# Patient Record
Sex: Female | Born: 1935 | Race: White | Hispanic: No | Marital: Single | Smoking: Never smoker
Health system: Southern US, Community
[De-identification: ages and names within clinical notes are randomized; demographics above are authoritative.]

## PROBLEM LIST (undated history)

## (undated) DIAGNOSIS — K219 Gastro-esophageal reflux disease without esophagitis: Secondary | ICD-10-CM

## (undated) DIAGNOSIS — I4891 Unspecified atrial fibrillation: Secondary | ICD-10-CM

---

## 2005-03-17 ENCOUNTER — Ambulatory Visit: Payer: Self-pay | Admitting: Cardiology

## 2010-03-15 ENCOUNTER — Encounter: Payer: Self-pay | Admitting: Emergency Medicine

## 2010-05-18 ENCOUNTER — Encounter: Payer: Self-pay | Admitting: Gastroenterology

## 2011-07-13 IMAGING — CT CT HEAD W/O CM
1 of 2 series · 13 of 30 positions shown, 17 images · non-contrast
Comparison: None

CLINICAL DATA: Fall.  Left-sided weakness.

CT HEAD WITHOUT CONTRAST
TECHNIQUE: Contiguous axial images were obtained from the base of
the skull through the vertex without contrast.

[Series 2: brain · axial · 0.47mm/px · z∈[+139,+265]mm · 13 of 28 slices shown, 17 images]
[im 2/28  brain]
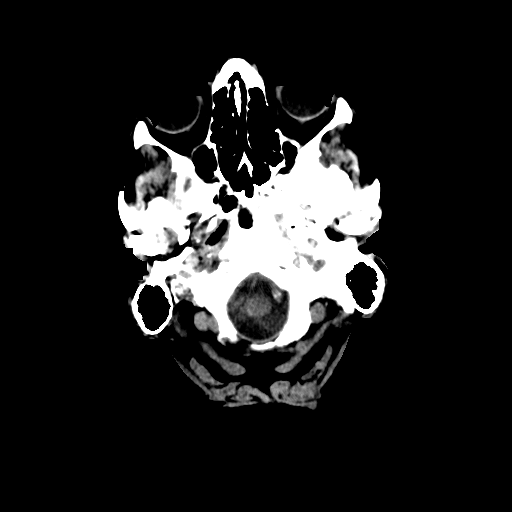
[im 2/28  bone]
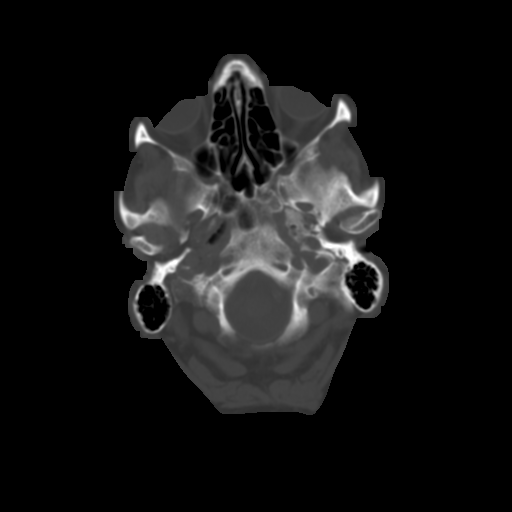
[im 4/28  brain]
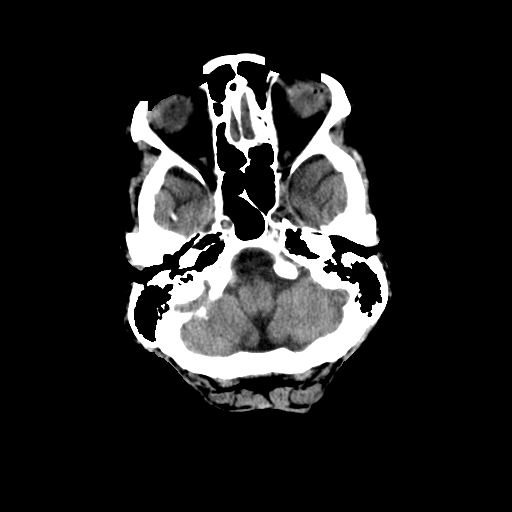
[im 6/28  brain]
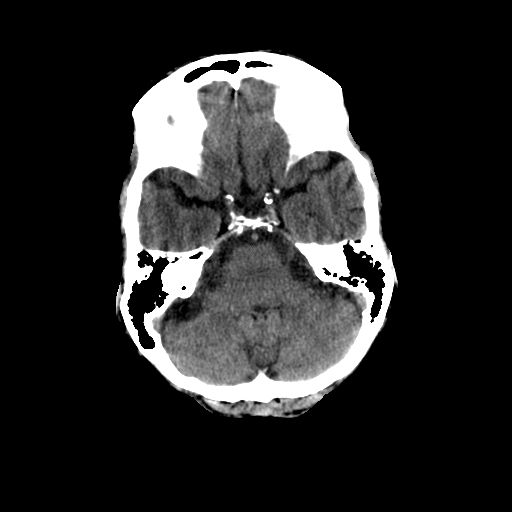
[im 8/28  brain]
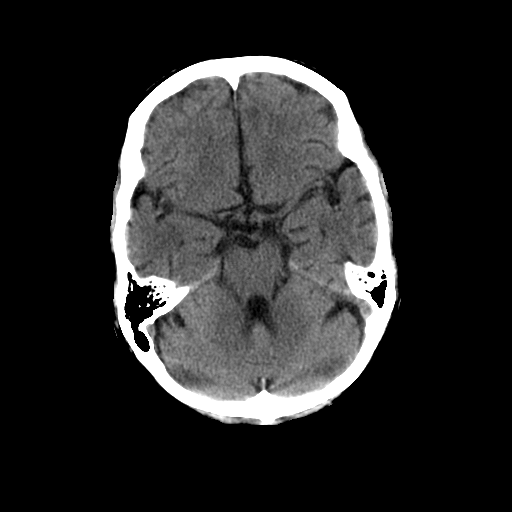
[im 10/28  brain]
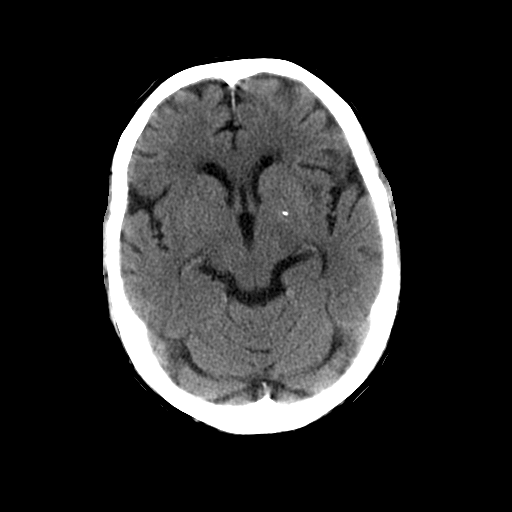
[im 10/28  bone]
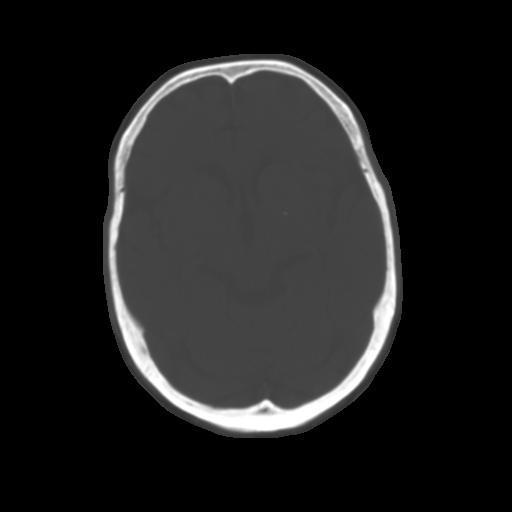
[im 12/28  brain]
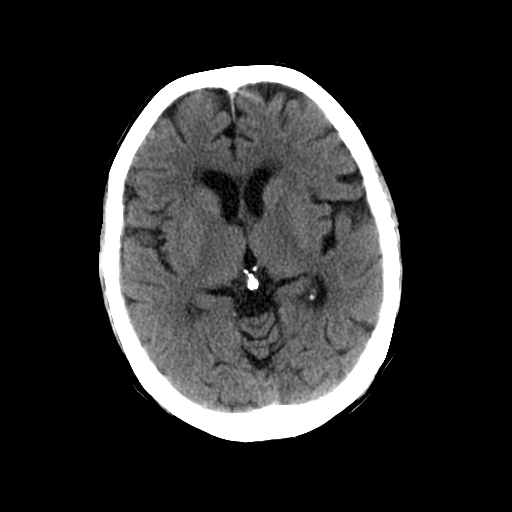
[im 14/28  brain]
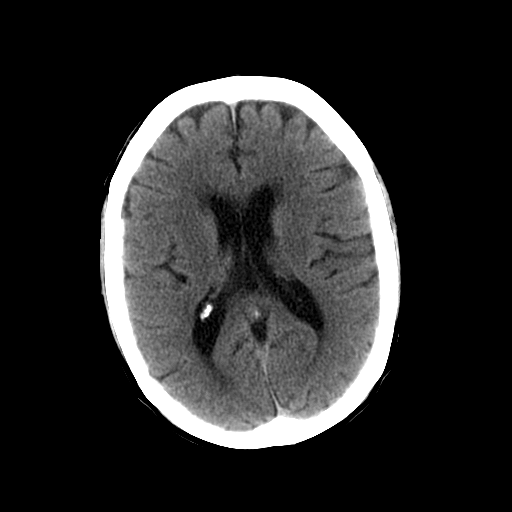
[im 16/28  brain]
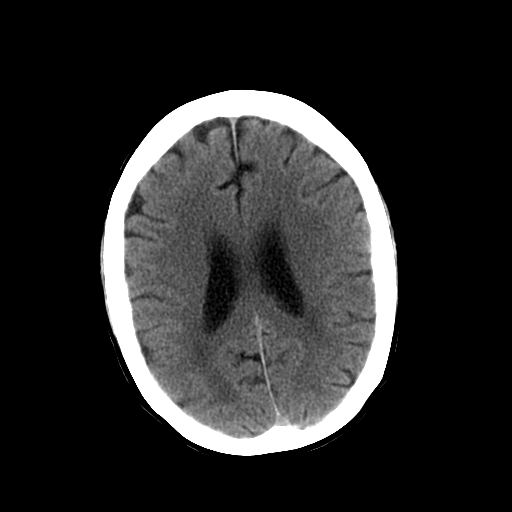
[im 18/28  brain]
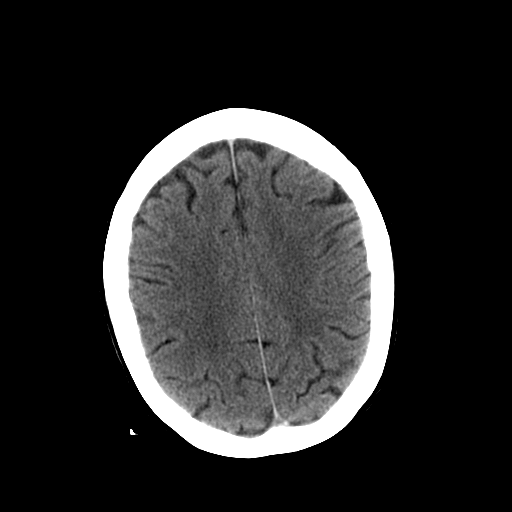
[im 18/28  bone]
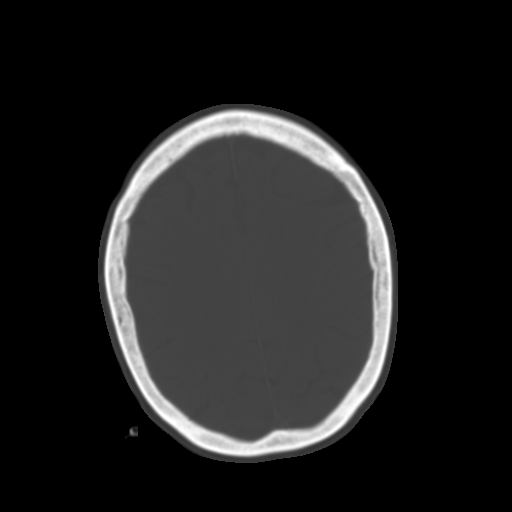
[im 20/28  brain]
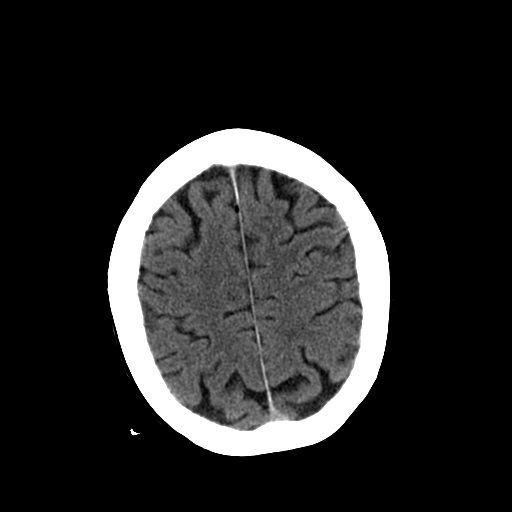
[im 22/28  brain]
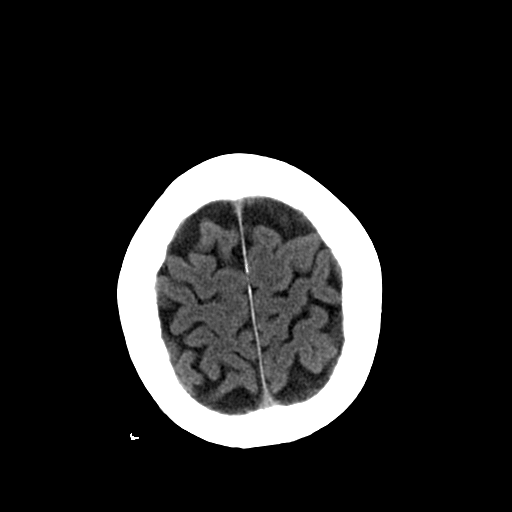
[im 24/28  brain]
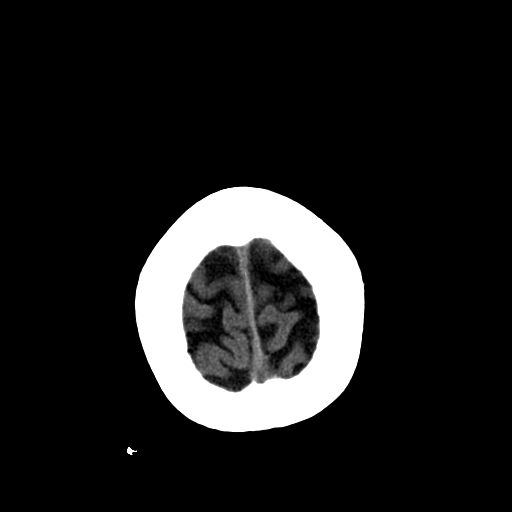
[im 26/28  brain]
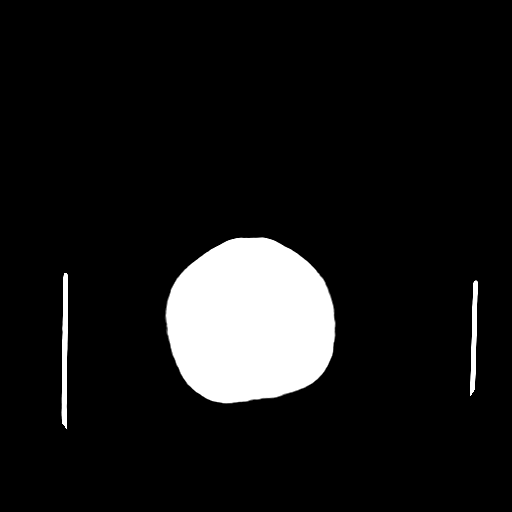
[im 26/28  bone]
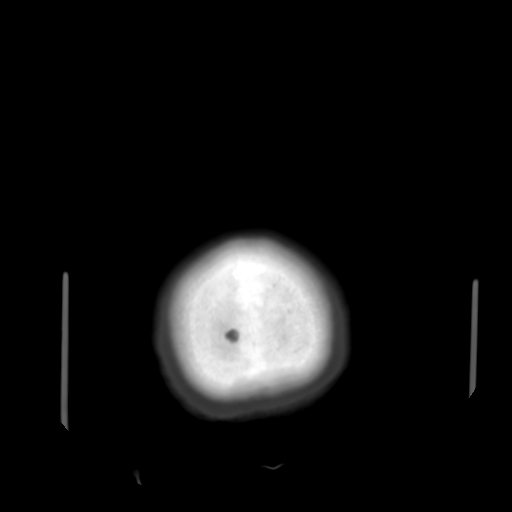

[13 of 30 positions shown; findings below may reference images not displayed]

FINDINGS: Age appropriate atrophy.  Chronic ischemic changes in the
white matter.  Sub insular hypodensity on the left appears chronic.
No acute cortical infarct.  Negative for hemorrhage or mass lesion.

Negative for skull fracture.  Visualized sinuses are clear.
IMPRESSION: Atrophy and chronic microvascular ischemia.  No acute abnormality.

## 2011-09-19 ENCOUNTER — Encounter: Payer: Self-pay | Admitting: Pulmonary Disease

## 2016-06-28 ENCOUNTER — Encounter (HOSPITAL_COMMUNITY): Payer: Self-pay | Admitting: *Deleted

## 2016-06-28 ENCOUNTER — Emergency Department (HOSPITAL_COMMUNITY): Payer: Self-pay

## 2016-06-28 ENCOUNTER — Emergency Department (HOSPITAL_COMMUNITY)
Admission: EM | Admit: 2016-06-28 | Discharge: 2016-06-28 | Discharge status: Absent without leave | Payer: Self-pay | Attending: Emergency Medicine | Admitting: Emergency Medicine

## 2016-06-28 DIAGNOSIS — R1084 Generalized abdominal pain: Secondary | ICD-10-CM

## 2016-06-28 HISTORY — DX: Gastro-esophageal reflux disease without esophagitis: K21.9

## 2016-06-28 HISTORY — DX: Unspecified atrial fibrillation: I48.91

## 2016-06-28 LAB — URINALYSIS, ROUTINE W REFLEX MICROSCOPIC
BACTERIA UA: NONE SEEN
Bilirubin Urine: NEGATIVE
Hgb urine dipstick: NEGATIVE
Ketones, ur: 5 mg/dL — AB
Leukocytes, UA: NEGATIVE
Nitrite: NEGATIVE
PROTEIN: NEGATIVE mg/dL
SQUAMOUS EPITHELIAL / LPF: NONE SEEN
Specific Gravity, Urine: 1.007 (ref 1.005–1.030)
pH: 6 (ref 5.0–8.0)

## 2016-06-28 MED ORDER — SODIUM CHLORIDE 0.9 % IV BOLUS (SEPSIS): 1000.0000 mL | Freq: Once | INTRAVENOUS | Status: DC

## 2016-06-28 MED ORDER — ONDANSETRON HCL 4 MG/2ML IJ SOLN: 4.0000 mg | Freq: Once | INTRAMUSCULAR | Status: DC

## 2016-06-28 MED ORDER — MORPHINE SULFATE (PF) 4 MG/ML IV SOLN: 4.0000 mg | Freq: Once | INTRAVENOUS | Status: DC

## 2016-06-28 NOTE — ED Notes (Signed)
Chart created in error

## 2016-06-28 NOTE — ED Provider Notes (Signed)
Patient's chart was merged with old chart. Please see separate encounter chart    Wendy Panderavid Hsienta Analia Zuk, MD 06/28/16 [id number]

## 2016-06-28 NOTE — ED Triage Notes (Deleted)
Per EMS, pt from home complains of abdominal pain and right knee pain since 4 PM today. Pt also had fall at 10AM. Pt denies pain or loss of consciousness. Pt has hx of GERD and a fib. Pt states she has not taken coumadin in several days. Pt denies chest pain, nausea, vomiting.   Pt is being treated for bladder infection and is supposed to have MRI for pancreatic lesion.   

## 2017-04-28 DEATH — deceased
